# Patient Record
Sex: Male | Born: 1985 | Race: Black or African American | Hispanic: No | Marital: Single | Smoking: Current every day smoker
Health system: Southern US, Community
[De-identification: ages and names within clinical notes are randomized; demographics above are authoritative.]

---
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
## 2010-05-26 ENCOUNTER — Emergency Department (HOSPITAL_COMMUNITY): Admission: EM | Admit: 2010-05-26 | Discharge: 2010-05-26 | Payer: Self-pay | Admitting: Emergency Medicine

## 2011-01-15 ENCOUNTER — Emergency Department (HOSPITAL_COMMUNITY)
Admission: EM | Admit: 2011-01-15 | Discharge: 2011-01-15 | Disposition: A | Discharge status: Home / Self Care | Payer: Self-pay | Attending: Emergency Medicine | Admitting: Emergency Medicine

## 2011-01-15 ENCOUNTER — Emergency Department (HOSPITAL_COMMUNITY): Payer: Self-pay

## 2011-01-15 DIAGNOSIS — R51 Headache: Secondary | ICD-10-CM | POA: Insufficient documentation

## 2011-01-15 DIAGNOSIS — R0789 Other chest pain: Secondary | ICD-10-CM | POA: Insufficient documentation

## 2013-06-29 ENCOUNTER — Emergency Department (HOSPITAL_COMMUNITY): Payer: Self-pay

## 2013-06-29 ENCOUNTER — Encounter (HOSPITAL_COMMUNITY): Payer: Self-pay | Admitting: Emergency Medicine

## 2013-06-29 ENCOUNTER — Emergency Department (HOSPITAL_COMMUNITY)
Admission: EM | Admit: 2013-06-29 | Discharge: 2013-06-29 | Disposition: A | Discharge status: Home / Self Care | Payer: Self-pay | Attending: Emergency Medicine | Admitting: Emergency Medicine

## 2013-06-29 DIAGNOSIS — S93402A Sprain of unspecified ligament of left ankle, initial encounter: Secondary | ICD-10-CM

## 2013-06-29 DIAGNOSIS — X500XXA Overexertion from strenuous movement or load, initial encounter: Secondary | ICD-10-CM | POA: Insufficient documentation

## 2013-06-29 DIAGNOSIS — F17200 Nicotine dependence, unspecified, uncomplicated: Secondary | ICD-10-CM | POA: Insufficient documentation

## 2013-06-29 DIAGNOSIS — S93409A Sprain of unspecified ligament of unspecified ankle, initial encounter: Secondary | ICD-10-CM | POA: Insufficient documentation

## 2013-06-29 DIAGNOSIS — Y9389 Activity, other specified: Secondary | ICD-10-CM | POA: Insufficient documentation

## 2013-06-29 DIAGNOSIS — Y929 Unspecified place or not applicable: Secondary | ICD-10-CM | POA: Insufficient documentation

## 2013-06-29 DIAGNOSIS — Z880 Allergy status to penicillin: Secondary | ICD-10-CM | POA: Insufficient documentation

## 2013-06-29 NOTE — ED Provider Notes (Signed)
CSN: [id number]     Arrival date & time 06/29/13  1134 History  This chart was scribed for non-physician practitioner, Raymon Mutton, PA-C working with Raeford Razor, MD by Greggory Stallion, ED scribe. This patient was seen in room TR06C/TR06C and the patient's care was started at 3:34 PM.   Chief Complaint  Patient presents with  . Ankle Pain   The history is provided by the patient. No language interpreter was used.   HPI Comments: Cole Johnson is a 27 y.o. male who presents to the Emergency Department complaining of left ankle injury that occurred last night. He states he jumped off of his truck and landed on his ankle wrong. Pt has sudden onset throbbing left ankle pain with associated swelling. He has used ice with little relief. Denies numbness, tingling, loss of sensation. Denies previous injury to ankle.   History reviewed. No pertinent past medical history. History reviewed. No pertinent past surgical history. History reviewed. No pertinent family history. History  Substance Use Topics  . Smoking status: Current Every Day Smoker  . Smokeless tobacco: Not on file  . Alcohol Use: Yes    Review of Systems  Musculoskeletal: Positive for arthralgias and joint swelling.  Neurological: Negative for numbness.  All other systems reviewed and are negative.    Allergies  Penicillins  Home Medications  No current outpatient prescriptions on file.  BP 145/67  Pulse 102  Temp(Src) 98.1 F (36.7 C) (Oral)  Resp 18  Wt 183 lb 3 oz (83.093 kg)  SpO2 100%  Physical Exam  Nursing note and vitals reviewed. Constitutional: He is oriented to person, place, and time. He appears well-developed and well-nourished. No distress.  HENT:  Head: Normocephalic and atraumatic.  Eyes: EOM are normal.  Neck: Normal range of motion. Neck supple.  Cardiovascular: Normal rate, regular rhythm and normal heart sounds.   Pulmonary/Chest: Effort normal and breath sounds normal. No respiratory  distress. He has no wheezes. He has no rales.  Musculoskeletal: Normal range of motion. He exhibits tenderness.       Feet:  Negative swelling, erythema, inflammation, warmth, ecchymosis, deformities noted to the left ankle. Mild discomfort upon palpation to the medial aspect of the left ankle and dorsal aspect of the left foot. Full ROM to left ankle and digits of the left foot.   Neurological: He is alert and oriented to person, place, and time. He exhibits normal muscle tone. Coordination normal.  Strength 5+/5+ to bilateral lower extremities with resistance applied equal distribution. Strength intact to the digits of the left foot Sensation intact with differentiation to sharp and dull touch to lower extremities bilaterally.   Skin: Skin is warm and dry.  Psychiatric: He has a normal mood and affect. His behavior is normal.    ED Course  Procedures (including critical care time)  DIAGNOSTIC STUDIES: Oxygen Saturation is 100% on RA, normal by my interpretation.    COORDINATION OF CARE: 3:37 PM-Discussed treatment plan which includes ankle brace, crutches and ibuprofen with pt at bedside and pt agreed to plan. Advised pt to follow up with orthopedics.   Dg Ankle Complete Left  06/29/2013   CLINICAL DATA:  Ankle injury and pain.  EXAM: LEFT ANKLE COMPLETE - 3+ VIEW  COMPARISON:  None.  FINDINGS: There is no evidence of fracture, dislocation, or joint effusion. There is no evidence of arthropathy or other focal bone abnormality. Soft tissues are unremarkable.  IMPRESSION: Negative.   Electronically Signed   By: Myles Rosenthal  M.D.   On: 06/29/2013 13:28    Labs Review Labs Reviewed - No data to display Imaging Review Dg Ankle Complete Left  06/29/2013   CLINICAL DATA:  Ankle injury and pain.  EXAM: LEFT ANKLE COMPLETE - 3+ VIEW  COMPARISON:  None.  FINDINGS: There is no evidence of fracture, dislocation, or joint effusion. There is no evidence of arthropathy or other focal bone  abnormality. Soft tissues are unremarkable.  IMPRESSION: Negative.   Electronically Signed   By: Myles Rosenthal M.D.   On: 06/29/2013 13:28    EKG Interpretation   None       MDM   1. Ankle sprain, left, initial encounter    Filed Vitals:   06/29/13 1138 06/29/13 1614  BP: 145/67 125/82  Pulse: 102 72  Temp: 98.1 F (36.7 C) 97.6 F (36.4 C)  TempSrc: Oral Oral  Resp: 18 16  Weight: 183 lb 3 oz (83.093 kg)   SpO2: 100% 98%   I personally performed the services described in this documentation, which was scribed in my presence. The recorded information has been reviewed and is accurate.  Patient presenting to the ED with left ankle pain. Patient reported that the other day he was jumping down from his truck and landed on a rock which caused his left foot to shift in an inverted manner leading to pain. Patient reported that there is a constant throbbing sensation to the left ankle. Stated that he noticed some swelling to the area. Reported that he has not iced, elevated the ankle.  Alert and oriented. GCS 15. Heart rate and rhythm normal. Pulses palpable and strong, radial and DP 2+ bilaterally. Lungs clear to auscultation to upper and lower lobes bilaterally. Negative swelling, erythema, inflammation, warmth, ecchymosis, deformities noted to the left ankle. Discomfort upon palpation to the the medial aspect of the left ankle and dorsal aspect of the left foot. Full ROM to the left ankle with minimal discomfort noted. Most discomfort noted with eversion and inversion of the left foot. Full ROM to the digits of the left food. Strength intact to the foot and digits. Sensation intact.  Plain film of the left ankle negative findings.  Negative fracture or dislocations. Patient neurovascularly intact. Patient placed ASO ankle brace. Patient has crutches. Patient stable, afebrile. Discharged patient. Recommended Ibuprofen for pain control. Referred patient to orthopedics. Discussed rest, ice,  elevation. Discussed with patient to continue to monitor symptoms and if symptoms are to worsen or change to report back to the ED - strict return instructions given.  Patient agreed to plan of car, understood, all questions answered.   Raymon Mutton, PA-C 06/30/13 1224

## 2013-06-29 NOTE — ED Notes (Signed)
Pt states he twisted left foot last pm getting out of a truck. No deformity noted.

## 2013-06-29 NOTE — Progress Notes (Signed)
Orthopedic Tech Progress Note Patient Details:  Cole Johnson August 20, 1985 [id number]  Ortho Devices Type of Ortho Device: ASO Ortho Device/Splint Interventions: Application   Jennye Moccasin 06/29/2013, 4:10 PM

## 2013-06-29 NOTE — ED Notes (Signed)
Per pt sts jumped on left ankle last night and he thinks he twisted it. sts pain ans swelling.

## 2013-06-29 NOTE — ED Notes (Signed)
Ortho called for aso ankle.

## 2013-07-07 NOTE — ED Provider Notes (Signed)
Medical screening examination/treatment/procedure(s) were performed by non-physician practitioner and as supervising physician I was immediately available for consultation/collaboration.  EKG Interpretation   None        Raeford Razor, MD 07/07/13 1712

## 2014-02-22 ENCOUNTER — Encounter (HOSPITAL_COMMUNITY): Payer: Self-pay | Admitting: Emergency Medicine

## 2014-02-22 ENCOUNTER — Emergency Department (HOSPITAL_COMMUNITY)
Admission: EM | Admit: 2014-02-22 | Discharge: 2014-02-22 | Disposition: A | Discharge status: Home / Self Care | Payer: Self-pay | Attending: Emergency Medicine | Admitting: Emergency Medicine

## 2014-02-22 DIAGNOSIS — Z880 Allergy status to penicillin: Secondary | ICD-10-CM | POA: Insufficient documentation

## 2014-02-22 DIAGNOSIS — J029 Acute pharyngitis, unspecified: Secondary | ICD-10-CM | POA: Insufficient documentation

## 2014-02-22 DIAGNOSIS — F17200 Nicotine dependence, unspecified, uncomplicated: Secondary | ICD-10-CM | POA: Insufficient documentation

## 2014-02-22 LAB — RAPID STREP SCREEN (MED CTR MEBANE ONLY): STREPTOCOCCUS, GROUP A SCREEN (DIRECT): NEGATIVE

## 2014-02-22 MED ORDER — HYDROCODONE-ACETAMINOPHEN 7.5-325 MG/15ML PO SOLN: 15.0000 mL | ORAL | Status: DC | PRN

## 2014-02-22 MED ORDER — CLINDAMYCIN HCL 150 MG PO CAPS: 300.0000 mg | ORAL_CAPSULE | Freq: Three times a day (TID) | ORAL | Status: DC

## 2014-02-22 NOTE — ED Notes (Signed)
Pt reports 1 week of sore throat

## 2014-02-22 NOTE — ED Provider Notes (Signed)
CSN: [id number]     Arrival date & time 02/22/14  1016 History  This chart was scribed for non-physician practitioner Sharilyn SitesLisa Megumi Treaster, PA-C working with [name], MD by Leone PayorSonum Patel, ED Scribe. This patient was seen in room TR07C/TR07C and the patient's care was started at 11:15 AM.    Chief Complaint  Patient presents with  . Sore Throat    The history is provided by the patient. No language interpreter was used.   HPI Comments: Cole KindDavid A Johnson is a 28 y.o. male who presents to the Emergency Department complaining of 1 week  of gradual onset, gradually worsening, constant sore throat. He describes the pain as sharp and states it is worse with swallowing. He denies sick contacts with similar symptoms. He denies fever, cough, difficulty swallowing. He denies history of recurrent strep throat.  No nausea, vomiting, diarrhea.  No intervention tried PTA.  History reviewed. No pertinent past medical history. History reviewed. No pertinent past surgical history. History reviewed. No pertinent family history. History  Substance Use Topics  . Smoking status: Current Every Day Smoker -- 0.30 packs/day    Types: Cigarettes  . Smokeless tobacco: Not on file  . Alcohol Use: Yes     Comment: about 20 oz of beer per day and .5 oz of liquor    Review of Systems  HENT: Positive for sore throat. Negative for trouble swallowing.   All other systems reviewed and are negative.     Allergies  Penicillins  Home Medications   Prior to Admission medications   Medication Sig Start Date End Date Taking? Authorizing Provider  benzocaine-menthol (SORE THROAT LOZENGES) 6-10 MG lozenge Take 1 lozenge by mouth as needed for sore throat.   Yes Historical Provider, MD   BP 139/88  Pulse 83  Temp(Src) 98.7 F (37.1 C) (Oral)  Resp 17  Ht 6\' 3"  (1.905 m)  Wt 187 lb (84.823 kg)  BMI 23.37 kg/m2  SpO2 99%  Physical Exam  Nursing note and vitals reviewed. Constitutional: He is oriented to person,  place, and time. He appears well-developed and well-nourished. No distress.  HENT:  Head: Normocephalic and atraumatic.  Mouth/Throat: Uvula is midline and mucous membranes are normal. No oral lesions. No trismus in the jaw. Posterior oropharyngeal erythema present. No oropharyngeal exudate, posterior oropharyngeal edema or tonsillar abscesses.  Tonsils 2+ bilaterally, small exudate present on right tonsil; uvula midline without peritonsillar abscess; handling secretions appropriately; no difficulty swallowing or speaking  Eyes: Conjunctivae and EOM are normal. Pupils are equal, round, and reactive to light.  Neck: Normal range of motion. Neck supple.  Cardiovascular: Normal rate, regular rhythm and normal heart sounds.   Pulmonary/Chest: Effort normal and breath sounds normal. No respiratory distress. He has no wheezes.  Musculoskeletal: Normal range of motion.  Lymphadenopathy:    He has no cervical adenopathy.  Neurological: He is alert and oriented to person, place, and time.  Skin: Skin is warm and dry. He is not diaphoretic.  Psychiatric: He has a normal mood and affect.    ED Course  Procedures (including critical care time)  DIAGNOSTIC STUDIES: Oxygen Saturation is 99% on RA, normal by my interpretation.    COORDINATION OF CARE: 11:19 AM Discussed treatment plan with pt at bedside and pt agreed to plan.   Labs Review Labs Reviewed  RAPID STREP SCREEN  CULTURE, GROUP A STREP    Imaging Review No results found.   EKG Interpretation None      MDM  Final diagnoses:  Sore throat   Rapid strep negative, culture pending.  Patient afebrile and non-toxic appearing without airway compromise.  Given current sx and presence of exudate on right tonsil will initiate tx for strep with clindamycin given his penicillin allergy.  Hycet given for pain.  FU with wellness clinic if problems occur.  Discussed plan with patient, he/she acknowledged understanding and agreed with plan of  care.  Return precautions given for new or worsening symptoms.  I personally performed the services described in this documentation, which was scribed in my presence. The recorded information has been reviewed and is accurate.  Garlon Hatchet, PA-C 02/22/14 1139

## 2014-02-22 NOTE — Discharge Instructions (Signed)
Take the prescribed medication as directed. Follow-up with [facility] if problems occur. Return to the ED for new or worsening symptoms.

## 2014-02-23 NOTE — ED Provider Notes (Signed)
Medical screening examination/treatment/procedure(s) were performed by non-physician practitioner and as supervising physician I was immediately available for consultation/collaboration.  Audree CamelScott T Sharvil Hoey, MD 02/23/14 [phone]

## 2014-02-24 LAB — CULTURE, GROUP A STREP

## 2014-04-08 ENCOUNTER — Encounter (HOSPITAL_COMMUNITY): Payer: Self-pay | Admitting: Emergency Medicine

## 2014-04-08 ENCOUNTER — Emergency Department (HOSPITAL_COMMUNITY)
Admission: EM | Admit: 2014-04-08 | Discharge: 2014-04-09 | Disposition: A | Discharge status: Home / Self Care | Payer: Self-pay | Attending: Emergency Medicine | Admitting: Emergency Medicine

## 2014-04-08 DIAGNOSIS — R109 Unspecified abdominal pain: Secondary | ICD-10-CM | POA: Insufficient documentation

## 2014-04-08 DIAGNOSIS — F17200 Nicotine dependence, unspecified, uncomplicated: Secondary | ICD-10-CM | POA: Insufficient documentation

## 2014-04-08 DIAGNOSIS — Z880 Allergy status to penicillin: Secondary | ICD-10-CM | POA: Insufficient documentation

## 2014-04-08 DIAGNOSIS — R112 Nausea with vomiting, unspecified: Secondary | ICD-10-CM | POA: Insufficient documentation

## 2014-04-08 DIAGNOSIS — R197 Diarrhea, unspecified: Secondary | ICD-10-CM | POA: Insufficient documentation

## 2014-04-08 LAB — COMPREHENSIVE METABOLIC PANEL
ALBUMIN: 4.3 g/dL (ref 3.5–5.2)
ALT: 29 U/L (ref 0–53)
AST: 46 U/L — ABNORMAL HIGH (ref 0–37)
Alkaline Phosphatase: 126 U/L — ABNORMAL HIGH (ref 39–117)
Anion gap: 14 (ref 5–15)
BUN: 6 mg/dL (ref 6–23)
CO2: 25 mEq/L (ref 19–32)
Calcium: 10.2 mg/dL (ref 8.4–10.5)
Chloride: 99 mEq/L (ref 96–112)
Creatinine, Ser: 0.75 mg/dL (ref 0.50–1.35)
GFR calc non Af Amer: >90 mL/min (ref >90)
Glucose, Bld: 99 mg/dL (ref 70–99)
Potassium: 4 mEq/L (ref 3.7–5.3)
Sodium: 138 mEq/L (ref 137–147)
Total Bilirubin: 0.4 mg/dL (ref 0.3–1.2)
Total Protein: 9.9 g/dL — ABNORMAL HIGH (ref 6.0–8.3)

## 2014-04-08 LAB — CBC WITH DIFFERENTIAL/PLATELET
BASOS ABS: 0 10*3/uL (ref 0.0–0.1)
Basophils Relative: 0 % (ref 0–1)
EOS ABS: 0.1 10*3/uL (ref 0.0–0.7)
EOS PCT: 1 % (ref 0–5)
HEMATOCRIT: 42.3 % (ref 39.0–52.0)
HEMOGLOBIN: 15.5 g/dL (ref 13.0–17.0)
LYMPHS ABS: 1.9 10*3/uL (ref 0.7–4.0)
LYMPHS PCT: 23 % (ref 12–46)
MCH: 28.2 pg (ref 26.0–34.0)
MCHC: 36.6 g/dL — AB (ref 30.0–36.0)
MCV: 76.9 fL — ABNORMAL LOW (ref 78.0–100.0)
MONOS PCT: 10 % (ref 3–12)
Monocytes Absolute: 0.8 10*3/uL (ref 0.1–1.0)
NEUTROS PCT: 67 % (ref 43–77)
Neutro Abs: 5.7 10*3/uL (ref 1.7–7.7)
PLATELETS: 326 10*3/uL (ref 150–400)
RBC: 5.5 MIL/uL (ref 4.22–5.81)
RDW: 14 % (ref 11.5–15.5)
WBC: 8.5 10*3/uL (ref 4.0–10.5)

## 2014-04-08 LAB — LIPASE, BLOOD: Lipase: 23 U/L (ref 11–59)

## 2014-04-08 MED ORDER — SODIUM CHLORIDE 0.9 % IV SOLN
1000.0000 mL | Freq: Once | INTRAVENOUS | Status: AC
  Administered 2014-04-08: 1000 mL via INTRAVENOUS

## 2014-04-08 MED ORDER — SODIUM CHLORIDE 0.9 % IV SOLN
1000.0000 mL | INTRAVENOUS | Status: DC
  Administered 2014-04-08: 1000 mL via INTRAVENOUS

## 2014-04-08 MED ORDER — ONDANSETRON HCL 4 MG/2ML IJ SOLN
4.0000 mg | Freq: Once | INTRAMUSCULAR | Status: AC
  Administered 2014-04-08: 4 mg via INTRAVENOUS
  Filled 2014-04-08: qty 2

## 2014-04-08 MED ORDER — HYDROMORPHONE HCL 1 MG/ML IJ SOLN
1.0000 mg | Freq: Once | INTRAMUSCULAR | Status: AC
  Administered 2014-04-08: 1 mg via INTRAVENOUS
  Filled 2014-04-08: qty 1

## 2014-04-08 MED ORDER — IOHEXOL 300 MG/ML  SOLN
25.0000 mL | Freq: Once | INTRAMUSCULAR | Status: AC | PRN
  Administered 2014-04-08: 25 mL via ORAL

## 2014-04-08 NOTE — ED Notes (Signed)
Pt c/o abdominal pain all day, emesis x 1, no diarrhea. Last BM today and normal

## 2014-04-08 NOTE — ED Notes (Signed)
No changes, alert, NAD, calm, interactive, drinking CT contrast

## 2014-04-09 ENCOUNTER — Emergency Department (HOSPITAL_COMMUNITY): Payer: Self-pay

## 2014-04-09 MED ORDER — ONDANSETRON 8 MG PO TBDP: 8.0000 mg | ORAL_TABLET | Freq: Three times a day (TID) | ORAL | Status: DC | PRN

## 2014-04-09 MED ORDER — IOHEXOL 300 MG/ML  SOLN
100.0000 mL | Freq: Once | INTRAMUSCULAR | Status: AC | PRN
  Administered 2014-04-09: 100 mL via INTRAVENOUS

## 2014-04-09 MED ORDER — NAPROXEN 500 MG PO TABS: 500.0000 mg | ORAL_TABLET | Freq: Two times a day (BID) | ORAL | Status: DC

## 2014-04-09 NOTE — ED Notes (Signed)
EDP at [facility], results and plan for d/c discussed.  UA cancelled.

## 2014-04-09 NOTE — ED Notes (Addendum)
Family at [facility]. Pt standing at [facility] using urinal, attempting urine sample. No changes, "feels better", alert, NAD, calm, interactive. VSS.

## 2014-04-09 NOTE — ED Notes (Signed)
Alert, NAD, calm, interactive. Denies nausea. Pain decreased. Pending results.

## 2014-04-09 NOTE — Discharge Instructions (Signed)

## 2014-04-09 NOTE — ED Provider Notes (Signed)
CSN: [id number]     Arrival date & time 04/08/14  2118 History   First MD Initiated Contact with Patient 04/08/14 2301     Chief Complaint  Patient presents with  . Abdominal Pain      HPI Patient presents to the emergency department complaining of abdominal pain for most of the day.  He reports nausea and vomiting.  Denies hematemesis.  Does report some diarrhea.  Denies fevers or chills.  States his pain is intermittent and crampy and now is more constant located on the right side of his abdomen.  Nothing worsens or improves his pain.  His pain is now constant.  His pain is moderate in severity   History reviewed. No pertinent past medical history. History reviewed. No pertinent past surgical history. History reviewed. No pertinent family history. History  Substance Use Topics  . Smoking status: Current Every Day Smoker -- 0.30 packs/day    Types: Cigarettes  . Smokeless tobacco: Never Used  . Alcohol Use: Yes     Comment: about 20 oz of beer per day and .5 oz of liquor    Review of Systems  All other systems reviewed and are negative.     Allergies  Penicillins  Home Medications   Prior to Admission medications   Medication Sig Start Date End Date Taking? Authorizing Provider  naproxen (NAPROSYN) 500 MG tablet Take 1 tablet (500 mg total) by mouth 2 (two) times daily. 04/09/14   Munachimso Palin M Itzamara Casas, MD  ondansetron (ZOFRAN ODT) 8 MG disintegrating tablet Take 1 tablet (8 mg total) by mouth every 8 (eight) hours as needed for nausea or vomiting. 04/09/14   Chyla Schlender M Latanya Hemmer, MD   BP 170/86  Pulse 60  Temp(Src) 98.3 F (36.8 C)  Resp 19  SpO2 97% Physical Exam  Nursing note and vitals reviewed. Constitutional: He is oriented to person, place, and time. He appears well-developed and well-nourished.  HENT:  Head: Normocephalic and atraumatic.  Eyes: EOM are normal.  Neck: Normal range of motion.  Cardiovascular: Normal rate, regular rhythm, normal heart sounds and intact  distal pulses.   Pulmonary/Chest: Effort normal and breath sounds normal. No respiratory distress.  Abdominal: Soft. He exhibits no distension.  Mild right-sided abdominal tenderness without rebound  Musculoskeletal: Normal range of motion.  Neurological: He is alert and oriented to person, place, and time.  Skin: Skin is warm and dry.  Psychiatric: He has a normal mood and affect. Judgment normal.    ED Course  Procedures (including critical care time) Labs Review Labs Reviewed  CBC WITH DIFFERENTIAL - Abnormal; Notable for the following:    MCV 76.9 (*)    MCHC 36.6 (*)    All other components within normal limits  COMPREHENSIVE METABOLIC PANEL - Abnormal; Notable for the following:    Total Protein 9.9 (*)    AST 46 (*)    Alkaline Phosphatase 126 (*)    All other components within normal limits  LIPASE, BLOOD  URINALYSIS, ROUTINE W REFLEX MICROSCOPIC    Imaging Review Ct Abdomen Pelvis W Contrast  04/09/2014   CLINICAL DATA:  Abdominal pain, vomiting  EXAM: CT ABDOMEN AND PELVIS WITH CONTRAST  TECHNIQUE: Multidetector CT imaging of the abdomen and pelvis was performed using the standard protocol following bolus [phone] ear.  Hepatobiliary: Liver is notable for a very mildly  nodular hepatic contour (series 2/image 13), equivocal. No focal hepatic lesion is seen.  Gallbladder is unremarkable. No intrahepatic or extrahepatic ductal dilatation.  Spleen: Within normal limits.  Pancreas: Within normal limits.  Stomach/Bowel: Stomach is unremarkable.  No evidence of bowel obstruction.  Normal appendix.  Adrenals/urinary tract: Adrenal glands are unremarkable.  Kidneys within normal limits.  No hydronephrosis.  Bladder is within normal limits.  Vascular/Lymphatic: No evidence of abdominal aortic aneurysm.  Small ileocolic/right lower quadrant nodes  measuring up to 6 mm short axis, likely reactive. Small left inguinal nodes measuring up to 12 mm short axis, likely reactive.  Reproductive: Prostate is unremarkable.  Musculoskeletal: Visualized osseous structures are within normal limits.  Other: No abdominopelvic ascites.  IMPRESSION: No evidence of bowel obstruction.  Normal appendix.  No CT findings to account for the patient's abdominal pain.   Electronically Signed   By: Charline Bills M.D.   On: 04/09/2014 00:22  I personally reviewed the imaging tests through PACS system I reviewed available ER/hospitalization records through the EMR    EKG Interpretation None      MDM   Final diagnoses:  Abdominal pain, unspecified abdominal location    Patient presents with nonspecific abdominal pain.  CT scan labs without significant abnormality.  Discharge home in good condition.  Recommended followup with her primary care physician.  He understands to return to the ER for new or worsening symptoms    Lyanne Co, MD 04/09/14 [phone]

## 2014-06-19 ENCOUNTER — Emergency Department (HOSPITAL_COMMUNITY)
Admission: EM | Admit: 2014-06-19 | Discharge: 2014-06-19 | Disposition: A | Discharge status: Home / Self Care | Payer: Self-pay | Attending: Emergency Medicine | Admitting: Emergency Medicine

## 2014-06-19 ENCOUNTER — Encounter (HOSPITAL_COMMUNITY): Payer: Self-pay | Admitting: Emergency Medicine

## 2014-06-19 DIAGNOSIS — S199XXA Unspecified injury of neck, initial encounter: Secondary | ICD-10-CM | POA: Insufficient documentation

## 2014-06-19 DIAGNOSIS — M542 Cervicalgia: Secondary | ICD-10-CM

## 2014-06-19 DIAGNOSIS — Y998 Other external cause status: Secondary | ICD-10-CM | POA: Insufficient documentation

## 2014-06-19 DIAGNOSIS — Y9389 Activity, other specified: Secondary | ICD-10-CM | POA: Insufficient documentation

## 2014-06-19 DIAGNOSIS — M62838 Other muscle spasm: Secondary | ICD-10-CM | POA: Insufficient documentation

## 2014-06-19 DIAGNOSIS — Z880 Allergy status to penicillin: Secondary | ICD-10-CM | POA: Insufficient documentation

## 2014-06-19 DIAGNOSIS — Z791 Long term (current) use of non-steroidal anti-inflammatories (NSAID): Secondary | ICD-10-CM | POA: Insufficient documentation

## 2014-06-19 DIAGNOSIS — Z720 Tobacco use: Secondary | ICD-10-CM | POA: Insufficient documentation

## 2014-06-19 DIAGNOSIS — Y92410 Unspecified street and highway as the place of occurrence of the external cause: Secondary | ICD-10-CM | POA: Insufficient documentation

## 2014-06-19 MED ORDER — NAPROXEN 500 MG PO TABS: 500.0000 mg | ORAL_TABLET | Freq: Two times a day (BID) | ORAL | Status: DC | PRN

## 2014-06-19 MED ORDER — HYDROCODONE-ACETAMINOPHEN 5-325 MG PO TABS: 1.0000 | ORAL_TABLET | Freq: Four times a day (QID) | ORAL | Status: DC | PRN

## 2014-06-19 MED ORDER — CYCLOBENZAPRINE HCL 10 MG PO TABS: 10.0000 mg | ORAL_TABLET | Freq: Three times a day (TID) | ORAL | Status: DC | PRN
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
## 2017-01-06 ENCOUNTER — Emergency Department (HOSPITAL_COMMUNITY)
Admission: EM | Admit: 2017-01-06 | Discharge: 2017-01-06 | Disposition: A | Discharge status: Home / Self Care | Payer: Self-pay | Attending: Emergency Medicine | Admitting: Emergency Medicine

## 2017-01-06 ENCOUNTER — Encounter (HOSPITAL_COMMUNITY): Payer: Self-pay | Admitting: *Deleted

## 2017-01-06 DIAGNOSIS — R112 Nausea with vomiting, unspecified: Secondary | ICD-10-CM | POA: Insufficient documentation

## 2017-01-06 DIAGNOSIS — F17210 Nicotine dependence, cigarettes, uncomplicated: Secondary | ICD-10-CM | POA: Insufficient documentation

## 2017-01-06 DIAGNOSIS — Z79899 Other long term (current) drug therapy: Secondary | ICD-10-CM | POA: Insufficient documentation

## 2017-01-06 DIAGNOSIS — R109 Unspecified abdominal pain: Secondary | ICD-10-CM | POA: Insufficient documentation

## 2017-01-06 DIAGNOSIS — R197 Diarrhea, unspecified: Secondary | ICD-10-CM | POA: Insufficient documentation

## 2017-01-06 LAB — URINALYSIS, ROUTINE W REFLEX MICROSCOPIC
BACTERIA UA: NONE SEEN
BILIRUBIN URINE: NEGATIVE
Glucose, UA: NEGATIVE mg/dL
KETONES UR: 80 mg/dL — AB
LEUKOCYTES UA: NEGATIVE
NITRITE: NEGATIVE
PROTEIN: NEGATIVE mg/dL
Specific Gravity, Urine: 1.024 (ref 1.005–1.030)
pH: 5 (ref 5.0–8.0)

## 2017-01-06 LAB — COMPREHENSIVE METABOLIC PANEL
ALBUMIN: 5.1 g/dL — AB (ref 3.5–5.0)
ALK PHOS: 96 U/L (ref 38–126)
ALT: 50 U/L (ref 17–63)
ANION GAP: 12 (ref 5–15)
AST: 60 U/L — ABNORMAL HIGH (ref 15–41)
BUN: 11 mg/dL (ref 6–20)
CALCIUM: 10.3 mg/dL (ref 8.9–10.3)
CHLORIDE: 99 mmol/L — AB (ref 101–111)
CO2: 25 mmol/L (ref 22–32)
Creatinine, Ser: 0.75 mg/dL (ref 0.61–1.24)
GFR calc non Af Amer: >60 mL/min (ref >60)
GLUCOSE: 85 mg/dL (ref 65–99)
Potassium: 3.7 mmol/L (ref 3.5–5.1)
SODIUM: 136 mmol/L (ref 135–145)
Total Bilirubin: 0.9 mg/dL (ref 0.3–1.2)
Total Protein: 8.9 g/dL — ABNORMAL HIGH (ref 6.5–8.1)

## 2017-01-06 LAB — CBC WITH DIFFERENTIAL/PLATELET
BASOS PCT: 0 %
Basophils Absolute: 0 10*3/uL (ref 0.0–0.1)
EOS ABS: 0.3 10*3/uL (ref 0.0–0.7)
EOS PCT: 4 %
HCT: 43.7 % (ref 39.0–52.0)
HEMOGLOBIN: 16.1 g/dL (ref 13.0–17.0)
Lymphocytes Relative: 31 %
Lymphs Abs: 2.4 10*3/uL (ref 0.7–4.0)
MCH: 28.8 pg (ref 26.0–34.0)
MCHC: 36.8 g/dL — AB (ref 30.0–36.0)
MCV: 78 fL (ref 78.0–100.0)
Monocytes Absolute: 0.6 10*3/uL (ref 0.1–1.0)
Monocytes Relative: 8 %
NEUTROS PCT: 58 %
Neutro Abs: 4.5 10*3/uL (ref 1.7–7.7)
PLATELETS: 253 10*3/uL (ref 150–400)
RBC: 5.6 MIL/uL (ref 4.22–5.81)
RDW: 14.2 % (ref 11.5–15.5)
WBC: 7.8 10*3/uL (ref 4.0–10.5)

## 2017-01-06 MED ORDER — ONDANSETRON 4 MG PO TBDP: 4.0000 mg | ORAL_TABLET | Freq: Three times a day (TID) | ORAL | 0 refills | Status: DC | PRN

## 2017-01-06 MED ORDER — ONDANSETRON HCL 4 MG/2ML IJ SOLN
4.0000 mg | Freq: Once | INTRAMUSCULAR | Status: AC | PRN
  Administered 2017-01-06: 4 mg via INTRAVENOUS
  Filled 2017-01-06: qty 2

## 2017-01-06 MED ORDER — DICYCLOMINE HCL 20 MG PO TABS: 20.0000 mg | ORAL_TABLET | Freq: Two times a day (BID) | ORAL | 0 refills | Status: AC | PRN

## 2017-01-06 MED ORDER — DICYCLOMINE HCL 10 MG PO CAPS
10.0000 mg | ORAL_CAPSULE | Freq: Once | ORAL | Status: AC
  Administered 2017-01-06: 10 mg via ORAL
  Filled 2017-01-06: qty 1

## 2017-01-06 MED ORDER — SODIUM CHLORIDE 0.9 % IV BOLUS (SEPSIS)
1000.0000 mL | Freq: Once | INTRAVENOUS | Status: AC
  Administered 2017-01-06: 1000 mL via INTRAVENOUS

## 2017-01-06 NOTE — Discharge Instructions (Signed)
Follow up with your primary care doctor to discuss your hospital visit. Continue to hydrate orally with small sips of fluids throughout the day. Use Zofran as directed for nausea & vomiting. Bentyl as needed for stomach cramping.  The 'BRAT' diet is suggested, then progress to diet as tolerated as symptoms abate.  Bananas.  Rice.  Applesauce.  Toast (and other simple starches such as crackers, potatoes, noodles).   SEEK IMMEDIATE MEDICAL ATTENTION IF: You begin having localized abdominal pain that does not go away or becomes severe A temperature above 101 develops Repeated vomiting occurs (multiple uncontrollable episodes) or you are unable to keep fluids down Blood is being passed in stools or vomit (bright red or black tarry stools).  New or worsening symptoms develop, any additional concerns.   Please have a low threshold for returning to the ER if your symptoms get worse or are not improving in the next two days.

## 2017-01-06 NOTE — ED Triage Notes (Signed)
Patient is alert and oriented x4.  He is complaining of nausea, vomiting and diarrhea that started last Friday.  Currently patient started that he is have left side flank pain 8 of 10.

## 2017-01-06 NOTE — ED Provider Notes (Signed)
[facility]Y DEPT Provider Note   CSN: [id number] Arrival date & time: 01/06/17  1305  By signing my name below, I, Cole Johnson, attest that this documentation has been prepared under the direction and in the presence of [facility] Center[facility]ime Ward, PA-C. Electronically Signed: Linna Darnerussell Johnson, Scribe. 01/06/2017. 3:19 PM.  History   Chief Complaint Chief Complaint  Patient presents with  . Emesis   The history is provided by the patient. No language interpreter was used.    HPI Comments: Cole KindDavid A Johnson is a 31 y.o. male who presents to the Emergency Department complaining of persistent nausea, vomiting, and diarrhea beginning two days ago. He reports some associated left flank pain. He states his symptoms presented shortly after returning from [place] to [place] where he ate a lot of seafood that he typically does not eat. Patient states he has been unable to tolerate most PO intake. He most recently vomited shortly prior to arrival today and has had some diarrhea in the ED. Patient states his emesis has looked like stomach contents. Patient states that one of his stools appeared darker than usual this morning but notes that he had taken Pepto Bismol shortly beforehand. No blood in the stool. He reports that since this time his stools have appeared normal. He denies hematemesis, fevers, chills, or any other associated symptoms. No camping. No sick contacts.   History reviewed. No pertinent past medical history.  There are no active problems to display for this patient.   History reviewed. No pertinent surgical history.     Home Medications    Prior to Admission medications   Medication Sig Start Date End Date Taking? Authorizing Provider  cyclobenzaprine (FLEXERIL) 10 MG tablet Take 1 tablet (10 mg total) by mouth 3 (three) times daily as needed for muscle spasms. 06/19/14   Street, [place], PA-C  dicyclomine (BENTYL) 20 MG tablet Take 1 tablet (20 mg total) by mouth 2 (two) times daily  as needed for spasms. 01/06/17   Ward, Chase PicketJaime Pilcher, PA-C  HYDROcodone-acetaminophen (NORCO) 5-325 MG per tablet Take 1-2 tablets by mouth every 6 (six) hours as needed for severe pain. 06/19/14   Street, [place], PA-C  naproxen (NAPROSYN) 500 MG tablet Take 1 tablet (500 mg total) by mouth 2 (two) times daily. Patient not taking: Reported on 06/19/2014 04/09/14   Azalia Bilisampos, Kevin, MD  naproxen (NAPROSYN) 500 MG tablet Take 1 tablet (500 mg total) by mouth 2 (two) times daily as needed for mild pain, moderate pain or headache (TAKE WITH MEALS.). 06/19/14   Street, Mercedes, PA-C  ondansetron (ZOFRAN ODT) 4 MG disintegrating tablet Take 1 tablet (4 mg total) by mouth every 8 (eight) hours as needed for nausea or vomiting. 01/06/17   Ward, Chase PicketJaime Pilcher, PA-C    Family History History reviewed. No pertinent family history.  Social History Social History  Substance Use Topics  . Smoking status: Current Every Day Smoker    Packs/day: 0.30    Types: Cigarettes  . Smokeless tobacco: Never Used  . Alcohol use Yes     Comment: about 20 oz of beer per day and .5 oz of liquor     Allergies   Penicillins   Review of Systems Review of Systems  Constitutional: Negative for chills and fever.  Gastrointestinal: Positive for diarrhea, nausea and vomiting. Negative for blood in stool.  Genitourinary: Positive for flank pain (L).  All other systems reviewed and are negative.  Physical Exam Updated Vital Signs BP (!) 144/90   Pulse  75   Temp 98.3 F (36.8 C) (Oral)   Resp 16   Ht 6\' 3"  (1.905 m)   Wt 92.1 kg (203 lb 1.6 oz)   SpO2 99%   BMI 25.39 kg/m   Physical Exam  Constitutional: He is oriented to person, place, and time. He appears well-developed and well-nourished. No distress.  HENT:  Head: Normocephalic and atraumatic.  Cardiovascular: Normal rate, regular rhythm and normal heart sounds.   No murmur heard. Pulmonary/Chest: Effort normal and breath sounds normal. No respiratory  distress. He has no wheezes. He has no rales.  Abdominal: Soft. Bowel sounds are normal. He exhibits no distension.  Diffuse generalized abdominal pain. No CVA tenderness. Mild left flank tenderness with no overlying skin changes.  Neurological: He is alert and oriented to person, place, and time.  Skin: Skin is warm and dry.  Nursing note and vitals reviewed.  ED Treatments / Results  Labs (all labs ordered are listed, but only abnormal results are displayed) Labs Reviewed  CBC WITH DIFFERENTIAL/PLATELET - Abnormal; Notable for the following:       Result Value   MCHC 36.8 (*)    All other components within normal limits  COMPREHENSIVE METABOLIC PANEL - Abnormal; Notable for the following:    Chloride 99 (*)    Total Protein 8.9 (*)    Albumin 5.1 (*)    AST 60 (*)    All other components within normal limits  URINALYSIS, ROUTINE W REFLEX MICROSCOPIC - Abnormal; Notable for the following:    Hgb urine dipstick MODERATE (*)    Ketones, ur 80 (*)    Squamous Epithelial / LPF 0-5 (*)    All other components within normal limits    EKG  EKG Interpretation None       Radiology No results found.  Procedures Procedures (including critical care time)  DIAGNOSTIC STUDIES: Oxygen Saturation is 98% on RA, normal by my interpretation.    COORDINATION OF CARE: 3:17 PM Discussed treatment plan with pt at bedside and pt agreed to plan.  Medications Ordered in ED Medications  sodium chloride 0.9 % bolus 1,000 mL (0 mLs Intravenous Stopped 01/06/17 1654)  ondansetron (ZOFRAN) injection 4 mg (4 mg Intravenous Given 01/06/17 1636)  dicyclomine (BENTYL) capsule 10 mg (10 mg Oral Given 01/06/17 1809)     Initial Impression / Assessment and Plan / ED Course  I have reviewed the triage vital signs and the nursing notes.  Pertinent labs & imaging results that were available during my care of the patient were reviewed by me and considered in my medical decision making (see chart for  details).    Cole Johnson is a 31 y.o. male who presents to ED for generalized abdominal pain, nausea, vomiting x 2 days. Also has some left flank pain. On exam, patient is afebrile, non-toxic appearing with a non-surgical abdominal exam. Minimal flank tenderness and no CVA tenderness. IV fluids given. Labs obtained and reassuring. On repeat examination, patient is now tolerating PO with no episodes of emesis. He has continued to have loose stools. He feels much better after 1L IV hydration. Repeat abdominal exam with no peritoneal signs however still has some mild left flank pain. UA resulted showing no signs of infection, but does have moderate amount of hgb. Shared decision making with myself, patient and mother in regards to obtaining CT of the abdomen for possibility of kidney stone. Patient states that he does not believe that he needs CT at this point.  He is feeling much better and would like to go home. Will treat with symptomatic home care, however I spoke with patient and mother at length about reasons to return to ER and discussed that he may very well need to return and obtain imaging. Patient and mother appear reliable to return if worrisome symptoms develop or pain is not improving. Rx for zofran and bentyl given. Patient understands diagnosis and plan of care as dictated above. All questions answered.   Final Clinical Impressions(s) / ED Diagnoses   Final diagnoses:  Nausea vomiting and diarrhea    New Prescriptions Discharge Medication List as of 01/06/2017  6:36 PM    START taking these medications   Details  dicyclomine (BENTYL) 20 MG tablet Take 1 tablet (20 mg total) by mouth 2 (two) times daily as needed for spasms., Starting Wed 01/06/2017, Print       I personally performed the services described in this documentation, which was scribed in my presence. The recorded information has been reviewed and is accurate.    Ward, Chase Picket, PA-C 01/06/17 Marcelyn Bruins, MD 01/07/17 [phone]

## 2018-02-21 ENCOUNTER — Encounter (HOSPITAL_COMMUNITY): Payer: Self-pay | Admitting: Emergency Medicine

## 2018-02-21 ENCOUNTER — Other Ambulatory Visit: Payer: Self-pay

## 2018-02-21 ENCOUNTER — Emergency Department (HOSPITAL_COMMUNITY): Payer: Self-pay

## 2018-02-21 ENCOUNTER — Emergency Department (HOSPITAL_COMMUNITY)
Admission: EM | Admit: 2018-02-21 | Discharge: 2018-02-21 | Disposition: A | Discharge status: Home / Self Care | Payer: Self-pay | Attending: Emergency Medicine | Admitting: Emergency Medicine

## 2018-02-21 DIAGNOSIS — F17210 Nicotine dependence, cigarettes, uncomplicated: Secondary | ICD-10-CM | POA: Insufficient documentation

## 2018-02-21 DIAGNOSIS — W230XXA Caught, crushed, jammed, or pinched between moving objects, initial encounter: Secondary | ICD-10-CM | POA: Insufficient documentation

## 2018-02-21 DIAGNOSIS — Y939 Activity, unspecified: Secondary | ICD-10-CM | POA: Insufficient documentation

## 2018-02-21 DIAGNOSIS — Y999 Unspecified external cause status: Secondary | ICD-10-CM | POA: Insufficient documentation

## 2018-02-21 DIAGNOSIS — S6992XA Unspecified injury of left wrist, hand and finger(s), initial encounter: Secondary | ICD-10-CM | POA: Insufficient documentation

## 2018-02-21 DIAGNOSIS — Y92810 Car as the place of occurrence of the external cause: Secondary | ICD-10-CM | POA: Insufficient documentation

## 2018-02-21 NOTE — Discharge Instructions (Signed)
Please follow-up with your primary care provider regarding your visit today.  If you not a primary care provider please see their attached resources to establish one. Please return to the emergency department for any new or worsening symptoms. You may use the splint provided to help protect your finger, it is important to remove the splint periodically and move your finger to avoid it becoming stiff. You may use over-the-counter anti-inflammatory medication such as ibuprofen or Tylenol as directed on packaging for pain. Please see the attached instructions for rest, ice, compression, elevation. Please continue to place clean dressings and antibiotic ointment on your finer wound and change them daily.  If your wounds began draining pus, have increased pain or redness or there is streaking or swelling around your wounds please return to the emergency department as soon as possible.  SEEK MEDICAL CARE IF: Your pain is not controlled with medicine. Your bruising or swelling gets worse. Your cast or splint is damaged. Your finger is numb or blue. Your finger feels colder than normal.  RESOURCE GUIDE  Chronic Pain Problems: Contact [facility]  [phone] Patients need to be referred by their primary care doctor.  Insufficient Money for Medicine: Contact United Way:  call "211" or [facility] [phone].  No Primary Care Doctor: Call [facility] Connect  [phone] - can help you locate a primary care doctor that  accepts your insurance, provides certain services, etc. Physician Referral Service- [phone]  Agencies that provide inexpensive medical care: [facility]  [phone] [facility]  [phone] [facility]  [phone] [facility]  [phone] Planned Parenthood  [phone] [facility]  [phone]  Medicaid-accepting [facility] Providers: [facility]- [address], [address]  [phone], Mon-Fri 9am-7pm, Sat 9am-1pm [facility]- [address], [address] [place]  [phone] [facility]- [address], [address] [place]  [phone] [facility]- [address] [place] [address]  [phone] Renaye RakersVeita Bland- [address], [address], [phone]  Only accepts [place] patients after they have their name  applied to their card  Self Pay (no insurance) in [facility]: Sickle Cell Patients: Dr Willey BladeEric Dean, [facility]  [address], [phone] [facility]- [address]  [phone]       Redge Gainer-     [facility] [place]- [address], [address]       -     [facility]- see information above (Speak to CitigroupPam H if you do not have insurance)       -  [facility]- [address], [phone]       -  [facility]- [address],  [phone]       -  [facility]- [address] [place] [address], [phone]       -  Dr Julio Sickssei-Bonsu-  [address], [address], [place], [phone]       -  [facility]- [address], [phone]       -  [facility]- [address] [place] [address], [phone], also [address], [phone]       -    [facility]- [address], [phone], 1st & 3rd Saturday   every month, 10am-1pm  1) Find a Librarian, academicDoctor and Pay Out of Pocket Although  you won't have to find out who is covered by your insurance plan, it is a good idea to ask around and get recommendations. You will then need to call the office and see if the doctor you have chosen will accept you as a new patient and what types of options they offer for patients who are self-pay. Some doctors offer discounts or will set up payment plans for their patients who do not have insurance, but you will need to ask so you aren't surprised when you get to your appointment.  2) Contact Your Local Health Department Not all health departments have doctors that  can see patients for sick visits, but many do, so it is worth a call to see if yours does. If you don't know where your local health department is, you can check in your phone book. The CDC also has a tool to help you locate your state's health department, and many state websites also have listings of all of their local health departments.  3) Find a Walk-in Clinic If your illness is not likely to be very severe or complicated, you may want to try a walk in clinic. These are popping up all over the country in pharmacies, drugstores, and shopping centers. They're usually staffed by nurse practitioners or physician assistants that have been trained to treat common illnesses and complaints. They're usually fairly quick and inexpensive. However, if you have serious medical issues or chronic medical problems, these are probably not your best option  STD Testing [facility] Department of [facility], STD Clinic, [address] [place], phone [phone] or [phone].  Monday - Friday, call for an appointment. [facility] of [employer], STD Clinic, [address], [place], phone [phone] or [phone].  Monday - Friday, call for an appointment.  Abuse/Neglect: [facility] Child Abuse Hotline 743-334-6392 [facility] Child Abuse Hotline [phone] (After Hours)  Emergency Shelter:  [facility] [phone]  Maternity Homes: Room at the [place] [facility] [phone] [facility] [phone]  MRSA Hotline #:   814-192-7385  [facility] Resources  [facility] [place] [facility]  United Way [facility]. [address]                 [address]         [address] [place] [address]  Blondell Reveal Phone:  [phone]                                  Phone:  [phone]                    Phone:  [phone]  [facility], [phone] [phone]       -     [facility] in [place], [address]  [phone], [facility]  [place] Child Abuse Hotline [phone]) 8301623356 or 970-372-2292(336) [phone] (After Hours)   Behavioral Health Services  Substance Abuse Resources: [facility] Drug [facility]  [phone] [facility] [phone] [facility] [phone] [facility] [phone] Residential & Outpatient Substance Abuse Program  [phone]  Psychological Services: [facility]  [phone] [facility]  [phone] [facility], [phone] [place], [place], ACCESS LINE: [phone] or [phone], [email or web address]  Dental Assistance  If unable to pay or uninsured, contact:  [facility] or [facility] to become qualified for the adult dental clinic.  Patients with Medicaid: [facility] [phone] [address], [phone], [phone]  If unable to pay, or uninsured, contact [facility] [phone]) or [facility] [phone] in [place], [phone] in [facility]) to become qualified for the adult dental clinic   Other Low-Cost Community Dental Services: [facility]- [address], [place], [place], [phone], [phone], [address]. [address], 2nd and 4th Thursday of the month at 6:30am.  10 clients each day by appointment, can sometimes see walk-in patients if someone does not show for an appointment. [facility]- [address], [place], [place], [phone], [phone] [facility]- [address], [place], [place], [phone], [phone] [facility]- [phone] [facility]- [phone] [facility]  [phone]

## 2018-02-21 NOTE — ED Triage Notes (Signed)
Pt reports slamming left pinky into car door. Two small cuts noted to pinky with swelling. Pain 6/10. Ice pack given in triage.

## 2018-02-21 NOTE — ED Provider Notes (Signed)
[facility] EM[facility]ENCY [facility] Provider Note   CSN: [id number] Arrival date & time: 02/21/18  1646     History   Chief Complaint Chief Complaint  Patient presents with  . Finger Injury    HPI Cole Johnson is a 32 y.o. male presenting for left fifth finger pain after shutting his finger in a car door approximately 330 this afternoon.  Patient states that pain was immediate, throbbing and 6/10 in nature.  He has not taken anything for his pain.  Patient states that pain is worse with palpation and movement of the finger.  Additionally patient with 2 small lacerations to either side of his left fifth finger, bleeding controlled with direct pressure.  HPI  History reviewed. No pertinent past medical history.  There are no active problems to display for this patient.   History reviewed. No pertinent surgical history.      Home Medications    Prior to Admission medications   Medication Sig Start Date End Date Taking? Authorizing Provider  dicyclomine (BENTYL) 20 MG tablet Take 1 tablet (20 mg total) by mouth 2 (two) times daily as needed for spasms. Patient not taking: Reported on 02/21/2018 01/06/17   Ward, Chase PicketJaime Pilcher, PA-C    Family History No family history on file.  Social History Social History   Tobacco Use  . Smoking status: Current Every Day Smoker    Packs/day: 0.30    Types: Cigarettes  . Smokeless tobacco: Never Used  Substance Use Topics  . Alcohol use: Yes    Comment: about 20 oz of beer per day and .5 oz of liquor  . Drug use: No     Allergies   Penicillins   Review of Systems Review of Systems  Constitutional: Negative.  Negative for chills, diaphoresis and fever.  Musculoskeletal: Positive for arthralgias and joint swelling.  Skin: Positive for wound. Negative for color change.  Neurological: Negative.  Negative for weakness and numbness.     Physical Exam Updated Vital Signs BP (!) 137/99 (BP Location: Right  Arm)   Pulse 64   Temp 98.8 F (37.1 C) (Oral)   Resp 15   Ht 6\' 2"  (1.88 m)   Wt 90.7 kg   SpO2 96%   BMI 25.68 kg/m   Physical Exam  Constitutional: He is oriented to person, place, and time. He appears well-developed and well-nourished. No distress.  HENT:  Head: Normocephalic and atraumatic.  Right Ear: External ear normal.  Left Ear: External ear normal.  Nose: Nose normal.  Eyes: Pupils are equal, round, and reactive to light. EOM are normal.  Neck: Trachea normal and normal range of motion. No tracheal deviation present.  Pulmonary/Chest: Effort normal. No respiratory distress.  Musculoskeletal: Normal range of motion.       Hands: Patient with swelling and skin abrasions as marked above. Tenderness to palpation over left 5th proximal phalanx. No snuffbox tenderness to palpation. No tenderness to palpation over flexor sheath.  Finger adduction/abduction intact with 5/5 strength.  Thumb opposition intact. Full active and resisted ROM to flexion/extension at wrist, MCP, PIP and DIP of all fingers.  FDS/FDP intact. Grip 5/5 strength.  Radial artery 2+ with <2sec cap refill in all fingers.  Sensation intact to light-tough in median/ulnar/radial distributions.   Neurological: He is alert and oriented to person, place, and time. No sensory deficit.  Skin: Skin is warm and dry. Capillary refill takes less than 2 seconds.  Psychiatric: He has a normal mood and  affect. His behavior is normal.     ED Treatments / Results  Labs (all labs ordered are listed, but only abnormal results are displayed) Labs Reviewed - No data to display  EKG None  Radiology Dg Finger Little Left  Result Date: 02/21/2018 CLINICAL DATA:  Little finger injury EXAM: LEFT LITTLE FINGER 2+V COMPARISON:  None. FINDINGS: There is no evidence of fracture or dislocation. There is no evidence of arthropathy or other focal bone abnormality. Soft tissues are unremarkable. IMPRESSION: Negative. Electronically  Signed   By: Deatra RobinsonKevin  Herman M.D.   On: 02/21/2018 17:35    Procedures Procedures (including critical care time)  Medications Ordered in ED Medications - No data to display   Initial Impression / Assessment and Plan / ED Course  I have reviewed the triage vital signs and the nursing notes.  Pertinent labs & imaging results that were available during my care of the patient were reviewed by me and considered in my medical decision making (see chart for details).    Patient presenting with left fifth finger injury that occurred today.  Imaging negative for acute findings.  Patient is neurovascularly intact to the left fifth finger as well as the entire left hand.  Patient with flexion and extension of the left fifth finger fully intact to all joints.  Provided splint here in the emergency department.  Patient and informed that he may use over-the-counter anti-inflammatory medications for his pain.  Patient instructed on rest ice compression and elevation.  Patient given resources to establish primary care provider and encouraged to follow-up with him regarding his visit today.  Patient has a small abrasion and small laceration are not suturable here in emergency department, the circular abrasion has no skin to repair, the small laceration is approximately 1 mm in depth and is not suturable.  There is no evidence of foreign body during my evaluation.  Bleeding controlled with direct pressure by patient prior to arrival.  Wound was irrigated by myself and bottom of wound was seen in a bloodless field.  No tendon, vessel or nerve involvement noted.  Wound was dressed with bacitracin and sterile gauze by nursing staff.  Splint was placed on finger.  At this time there does not appear to be any evidence of an acute emergency medical condition and the patient appears stable for discharge with appropriate outpatient follow up. Diagnosis was discussed with patient who verbalizes understanding of care plan and  is agreeable to discharge. I have discussed return precautions with patient and family who verbalize understanding of return precautions. Patient strongly encouraged to follow-up with their PCP. All questions answered.    Note: Portions of this report may have been transcribed using voice recognition software. Every effort was made to ensure accuracy; however, inadvertent computerized transcription errors may still be present.   Final Clinical Impressions(s) / ED Diagnoses   Final diagnoses:  Injury of finger of left hand, initial encounter    ED Discharge Orders    None       Elizabeth PalauMorelli, Brandon A, PA-C 02/21/18 2343    Gwyneth SproutPlunkett, Whitney, MD 02/21/18 2349

## 2021-12-15 ENCOUNTER — Encounter (HOSPITAL_COMMUNITY): Payer: Self-pay

## 2021-12-15 ENCOUNTER — Emergency Department (HOSPITAL_COMMUNITY): Payer: BC Managed Care – PPO

## 2021-12-15 ENCOUNTER — Emergency Department (HOSPITAL_COMMUNITY)
Admission: EM | Admit: 2021-12-15 | Discharge: 2021-12-16 | Disposition: A | Discharge status: Home / Self Care | Payer: BC Managed Care – PPO | Attending: Emergency Medicine | Admitting: Emergency Medicine

## 2021-12-15 DIAGNOSIS — F17200 Nicotine dependence, unspecified, uncomplicated: Secondary | ICD-10-CM | POA: Diagnosis not present

## 2021-12-15 DIAGNOSIS — R4701 Aphasia: Secondary | ICD-10-CM | POA: Diagnosis not present

## 2021-12-15 DIAGNOSIS — I10 Essential (primary) hypertension: Secondary | ICD-10-CM | POA: Diagnosis not present

## 2021-12-15 DIAGNOSIS — R200 Anesthesia of skin: Secondary | ICD-10-CM | POA: Diagnosis present

## 2021-12-15 DIAGNOSIS — Z20822 Contact with and (suspected) exposure to covid-19: Secondary | ICD-10-CM | POA: Insufficient documentation

## 2021-12-15 DIAGNOSIS — R202 Paresthesia of skin: Secondary | ICD-10-CM | POA: Diagnosis not present

## 2021-12-15 LAB — COMPREHENSIVE METABOLIC PANEL
ALT: 64 U/L — ABNORMAL HIGH (ref 0–44)
AST: 81 U/L — ABNORMAL HIGH (ref 15–41)
Albumin: 5.1 g/dL — ABNORMAL HIGH (ref 3.5–5.0)
Alkaline Phosphatase: 86 U/L (ref 38–126)
Anion gap: 9 (ref 5–15)
BUN: 10 mg/dL (ref 6–20)
CO2: 24 mmol/L (ref 22–32)
Calcium: 10 mg/dL (ref 8.9–10.3)
Chloride: 107 mmol/L (ref 98–111)
Creatinine, Ser: 0.87 mg/dL (ref 0.61–1.24)
GFR, Estimated: >60 mL/min (ref >60)
Glucose, Bld: 99 mg/dL (ref 70–99)
Potassium: 3.3 mmol/L — ABNORMAL LOW (ref 3.5–5.1)
Sodium: 140 mmol/L (ref 135–145)
Total Bilirubin: 0.7 mg/dL (ref 0.3–1.2)
Total Protein: 8.8 g/dL — ABNORMAL HIGH (ref 6.5–8.1)

## 2021-12-15 LAB — CBC
HCT: 42.3 % (ref 39.0–52.0)
Hemoglobin: 15.3 g/dL (ref 13.0–17.0)
MCH: 30 pg (ref 26.0–34.0)
MCHC: 36.2 g/dL — ABNORMAL HIGH (ref 30.0–36.0)
MCV: 82.9 fL (ref 80.0–100.0)
Platelets: 293 10*3/uL (ref 150–400)
RBC: 5.1 MIL/uL (ref 4.22–5.81)
RDW: 13.5 % (ref 11.5–15.5)
WBC: 8.6 10*3/uL (ref 4.0–10.5)
nRBC: 0 % (ref 0.0–0.2)

## 2021-12-15 LAB — DIFFERENTIAL
Abs Immature Granulocytes: 0.02 10*3/uL (ref 0.00–0.07)
Basophils Absolute: 0.1 10*3/uL (ref 0.0–0.1)
Basophils Relative: 1 %
Eosinophils Absolute: 0.1 10*3/uL (ref 0.0–0.5)
Eosinophils Relative: 1 %
Immature Granulocytes: 0 %
Lymphocytes Relative: 43 %
Lymphs Abs: 3.7 10*3/uL (ref 0.7–4.0)
Monocytes Absolute: 0.9 10*3/uL (ref 0.1–1.0)
Monocytes Relative: 10 %
Neutro Abs: 3.8 10*3/uL (ref 1.7–7.7)
Neutrophils Relative %: 45 %

## 2021-12-15 LAB — RAPID URINE DRUG SCREEN, HOSP PERFORMED
Amphetamines: NOT DETECTED
Barbiturates: NOT DETECTED
Benzodiazepines: NOT DETECTED
Cocaine: NOT DETECTED
Opiates: NOT DETECTED
Tetrahydrocannabinol: NOT DETECTED

## 2021-12-15 LAB — URINALYSIS, ROUTINE W REFLEX MICROSCOPIC
Bacteria, UA: NONE SEEN
Bilirubin Urine: NEGATIVE
Glucose, UA: NEGATIVE mg/dL
Ketones, ur: NEGATIVE mg/dL
Nitrite: NEGATIVE
Protein, ur: NEGATIVE mg/dL
Specific Gravity, Urine: 1.006 (ref 1.005–1.030)
pH: 5 (ref 5.0–8.0)

## 2021-12-15 LAB — ETHANOL: Alcohol, Ethyl (B): 220 mg/dL — ABNORMAL HIGH (ref <10)

## 2021-12-15 LAB — I-STAT CHEM 8, ED
BUN: 8 mg/dL (ref 6–20)
Calcium, Ion: 1.17 mmol/L (ref 1.15–1.40)
Chloride: 103 mmol/L (ref 98–111)
Creatinine, Ser: 1.2 mg/dL (ref 0.61–1.24)
Glucose, Bld: 100 mg/dL — ABNORMAL HIGH (ref 70–99)
HCT: 46 % (ref 39.0–52.0)
Hemoglobin: 15.6 g/dL (ref 13.0–17.0)
Potassium: 3.4 mmol/L — ABNORMAL LOW (ref 3.5–5.1)
Sodium: 142 mmol/L (ref 135–145)
TCO2: 23 mmol/L (ref 22–32)

## 2021-12-15 LAB — RESP PANEL BY RT-PCR (FLU A&B, COVID) ARPGX2
Influenza A by PCR: NEGATIVE
Influenza B by PCR: NEGATIVE
SARS Coronavirus 2 by RT PCR: NEGATIVE

## 2021-12-15 LAB — APTT: aPTT: 30 seconds (ref 24–36)

## 2021-12-15 LAB — PROTIME-INR
INR: 0.9 (ref 0.8–1.2)
Prothrombin Time: 11.9 seconds (ref 11.4–15.2)

## 2021-12-15 NOTE — ED Triage Notes (Signed)
Pt arrived via POV, c/o expressive aphasia, headache, dizziness, feeling off overall as well as numbness and tingling to right arm that started at 9am this morning.

## 2021-12-15 NOTE — ED Provider Triage Note (Signed)
Emergency Medicine Provider Triage Evaluation Note  Cole Johnson , a 36 y.o. male  was evaluated in triage.  Pt complains of numbness and tingling to his right upper extremity.  Patient states that this morning around 930 or 10 AM he developed numbness into his right upper extremity.  Patient says that he has a history of high blood pressure, is not currently taking any medication.  Patient states that the last known normal time was around 9 AM.  Patient discussed with Dr. Effie Shy who advised me to not activate code stroke or teleneurology but to order head CT.  Review of Systems  Positive:  Negative:   Physical Exam  BP (!) 185/123 (BP Location: Left Arm)   Pulse (!) 105   Temp 98.7 F (37.1 C) (Oral)   Resp 20   Ht 6\' 2"  (1.88 m)   Wt 97.5 kg   SpO2 99%   BMI 27.60 kg/m  Gen:   Awake, no distress   Resp:  Normal effort  MSK:   Moves extremities without difficulty  Other:  Decree sensation right side of 4, right upper extremity on examination.  5-5 strength of bilateral upper and lower extremities.  Medical Decision Making  Medically screening exam initiated at 6:48 PM.  Appropriate orders placed.  was informed that the remainder of the evaluation will be completed by another provider, this initial triage assessment does not replace that evaluation, and the importance of remaining in the ED until their evaluation is complete.     Cole Kind, PA-C 12/15/21 1849

## 2021-12-15 NOTE — ED Triage Notes (Signed)
Pt sent from [facility] for MRI

## 2021-12-15 NOTE — ED Provider Notes (Signed)
8:58 PM Call from PA Delice Bison for ED to ED transfer.  Currently at [facility] without MRI capabilities.  Right sided numbness and tingling in RUE, possible aphasia this morning with symptom onset was 930/10AM. NIH stroke scale 0.  Smoker, untreated hypertension  Workup:  Stable CT Head  Plan: Transfer to [facility] for MRI. Control blood pressure if no stroke and DC home.    Franne Forts, DO 12/15/21 2101

## 2021-12-15 NOTE — Discharge Instructions (Signed)
Please report to the [facility] ED for MRI. Dr. Wallace Cullens knows that you are coming.

## 2021-12-15 NOTE — ED Provider Notes (Signed)
[facility]-EMERGENCY DEPT Provider Note   CSN: [id number] Arrival date & time: 12/15/21  1829     History  Chief Complaint  Patient presents with   Aphasia    Cole Johnson is a 36 y.o. male with medical history of hypertension, current smoker.  The patient presents to the ED for evaluation of right-sided numbness and tingling as well as expressive aphasia.  Reports that this morning at 9:30 AM, he developed right-sided numbness and tingling into his right upper extremity.  The patient also stated at this time he had slurred speech, inability to find words.  Patient states that the symptoms decided.  Patient does still have right-sided numbness and tingling however.  Patient also has numbness and tingling of the right side of his forehead.  Patient denies any recent falls, fevers, nausea, vomiting, shortness of breath, chest pain.  Patient reports that he has high blood pressure however he has not been placed on any medications by his PCP.  HPI     Home Medications Prior to Admission medications   Medication Sig Start Date End Date Taking? Authorizing Provider  dicyclomine (BENTYL) 20 MG tablet Take 1 tablet (20 mg total) by mouth 2 (two) times daily as needed for spasms. Patient not taking: Reported on 02/21/2018 01/06/17   Ward, Chase Picket, PA-C      Allergies    Penicillins    Review of Systems   Review of Systems  Neurological:  Positive for speech difficulty and numbness.  All other systems reviewed and are negative.  Physical Exam Updated Vital Signs BP (!) 155/109   Pulse 75   Temp 98.7 F (37.1 C) (Oral)   Resp 17   Ht 6\' 2"  (1.88 m)   Wt 97.5 kg   SpO2 94%   BMI 27.60 kg/m  Physical Exam Vitals and nursing note reviewed.  Constitutional:      General: He is not in acute distress.    Appearance: Normal appearance. He is not ill-appearing, toxic-appearing or diaphoretic.  HENT:     Head: Normocephalic and atraumatic.     Nose: Nose  normal. No congestion.     Mouth/Throat:     Mouth: Mucous membranes are moist.     Pharynx: Oropharynx is clear.  Eyes:     Extraocular Movements: Extraocular movements intact.     Conjunctiva/sclera: Conjunctivae normal.     Pupils: Pupils are equal, round, and reactive to light.  Cardiovascular:     Rate and Rhythm: Normal rate and regular rhythm.  Pulmonary:     Effort: Pulmonary effort is normal.     Breath sounds: Normal breath sounds. No wheezing.  Abdominal:     General: Abdomen is flat. Bowel sounds are normal.     Palpations: Abdomen is soft.     Tenderness: There is no abdominal tenderness.  Musculoskeletal:     Cervical back: Normal range of motion and neck supple. No rigidity or tenderness.  Skin:    General: Skin is warm and dry.     Capillary Refill: Capillary refill takes less than 2 seconds.  Neurological:     General: No focal deficit present.     Mental Status: He is alert and oriented to person, place, and time.     GCS: GCS eye subscore is 4. GCS verbal subscore is 5. GCS motor subscore is 6.     Cranial Nerves: Cranial nerve deficit present.     Sensory: No sensory deficit.  Comments: 5 out of 5 strength to upper and lower extremities bilaterally.  Decree sensation to right upper extremity, right side of forehead.  No facial droop.    ED Results / Procedures / Treatments   Labs (all labs ordered are listed, but only abnormal results are displayed) Labs Reviewed  ETHANOL - Abnormal; Notable for the following components:      Result Value   Alcohol, Ethyl (B) 220 (*)    All other components within normal limits  CBC - Abnormal; Notable for the following components:   MCHC 36.2 (*)    All other components within normal limits  COMPREHENSIVE METABOLIC PANEL - Abnormal; Notable for the following components:   Potassium 3.3 (*)    Total Protein 8.8 (*)    Albumin 5.1 (*)    AST 81 (*)    ALT 64 (*)    All other components within normal limits   I-STAT CHEM 8, ED - Abnormal; Notable for the following components:   Potassium 3.4 (*)    Glucose, Bld 100 (*)    All other components within normal limits  RESP PANEL BY RT-PCR (FLU A&B, COVID) ARPGX2  PROTIME-INR  APTT  DIFFERENTIAL  RAPID URINE DRUG SCREEN, HOSP PERFORMED  URINALYSIS, ROUTINE W REFLEX MICROSCOPIC    EKG None  Radiology CT HEAD WO CONTRAST ( )  Result Date: 12/15/2021 CLINICAL DATA:  Numbness and tingling to the right upper extremity EXAM: CT HEAD WITHOUT CONTRAST TECHNIQUE: Contiguous axial images were obtained from the base of the skull through the vertex without intravenous contrast. RADIATION DOSE REDUCTION: This exam was performed according to the departmental dose-optimization program which includes automated exposure control, adjustment of the mA and/or kV according to patient size and/or use of iterative reconstruction technique. COMPARISON:  None Available. FINDINGS: Brain: No evidence of acute infarction, hemorrhage, hydrocephalus, extra-axial collection or mass lesion/mass effect. Vascular: No hyperdense vessel or unexpected calcification. Skull: Normal. Negative for fracture or focal lesion. Sinuses/Orbits: No acute finding. Other: None IMPRESSION: Negative non contrasted CT appearance of the brain Electronically Signed   By: Jasmine Pang M.D.   On: 12/15/2021 19:21    Procedures Procedures    Medications Ordered in ED Medications - No data to display  ED Course/ Medical Decision Making/ A&P                           Medical Decision Making Amount and/or Complexity of Data Reviewed Labs: ordered. Radiology: ordered.   36 year old male presents to the ED for evaluation.  Please see HPI for further details.  Patient last known well time was 9 AM this morning.  Patient had CT here which was negative for any intracranial abnormality however the patient does still have symptoms and right-sided numbness.  MRI tech at [facility] did not return  calls, charge nurse here at [facility] is also unable to locate MRI tech.  Patient needs imaging of head with MRI, he will need to be seen at [facility] for this.  I have called and discussed this patient with Dr. Wallace Cullens and she is aware that he is on the way.   Final Clinical Impression(s) / ED Diagnoses Final diagnoses:  Numbness    Rx / DC Orders ED Discharge Orders     None         Clent Ridges 12/15/21 2108    Derwood Kaplan, MD 12/16/21 1609

## 2021-12-16 ENCOUNTER — Ambulatory Visit (HOSPITAL_COMMUNITY): Admission: RE | Admit: 2021-12-16 | Payer: BC Managed Care – PPO | Source: Ambulatory Visit

## 2022-11-20 IMAGING — CT CT HEAD W/O CM
3 series · 14 of 47 positions shown, 16 images · non-contrast
Comparison: None Available.

CLINICAL DATA: Numbness and tingling to the right upper extremity



[Series 2: head wo · axial · 0.50mm/px · z∈[-144,-9]mm · 8 of 33 slices shown, 10 images]
[im 3/33  brain]
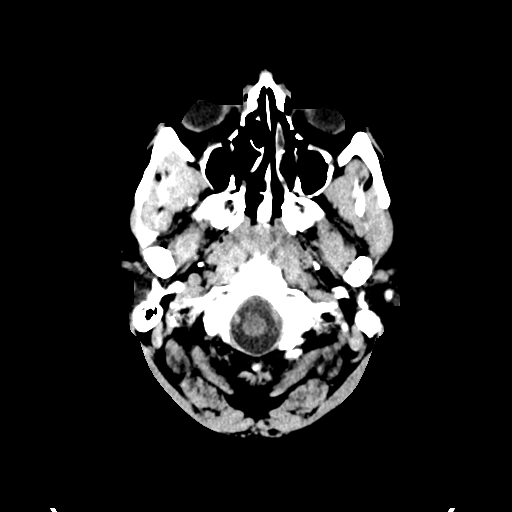
[im 3/33  bone]
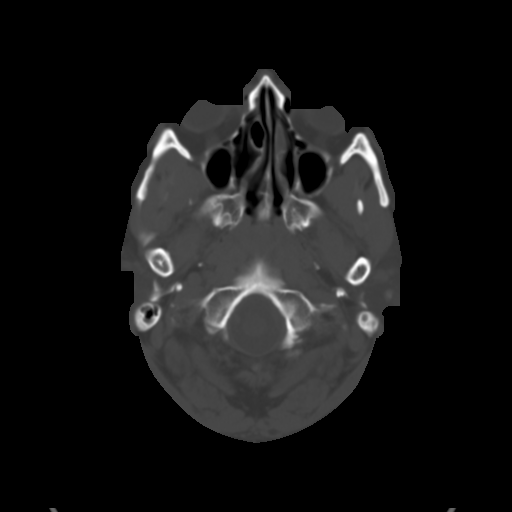
[im 7/33  brain]
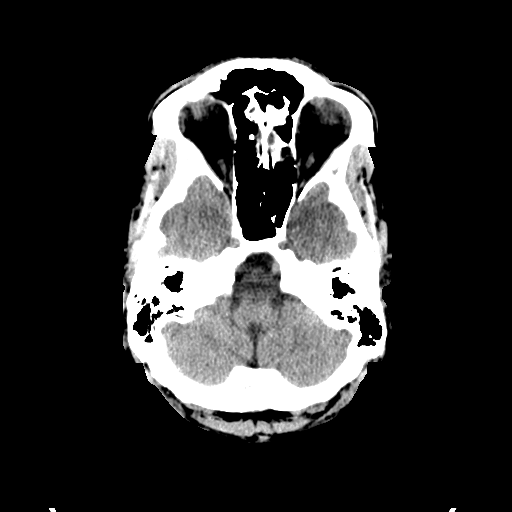
[im 10/33  brain]
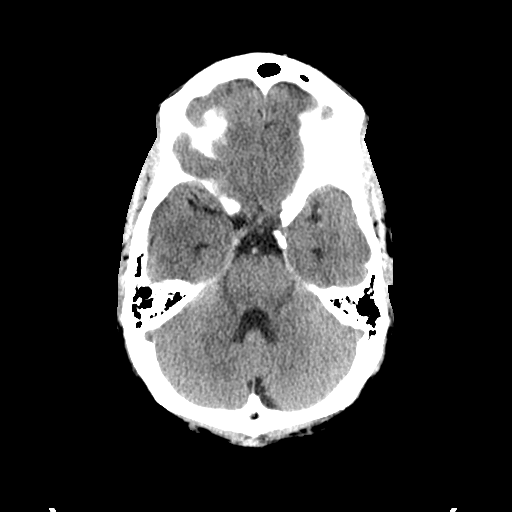
[im 15/33  brain]
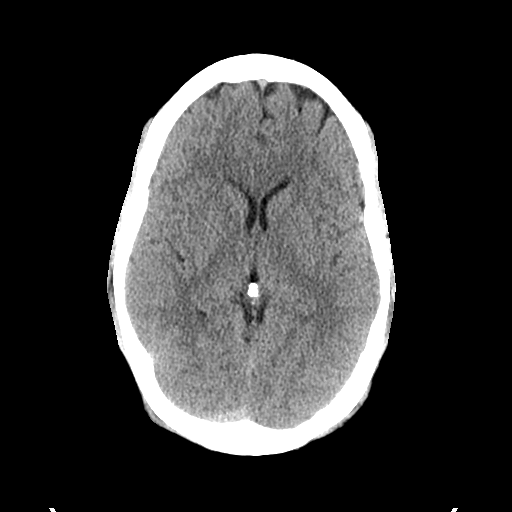
[im 18/33  brain]
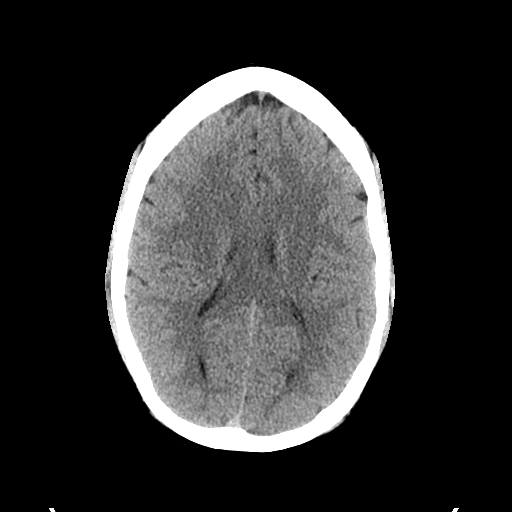
[im 18/33  bone]
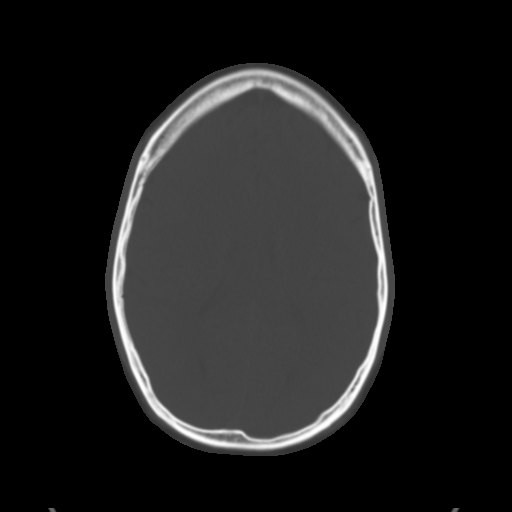
[im 23/33  brain]
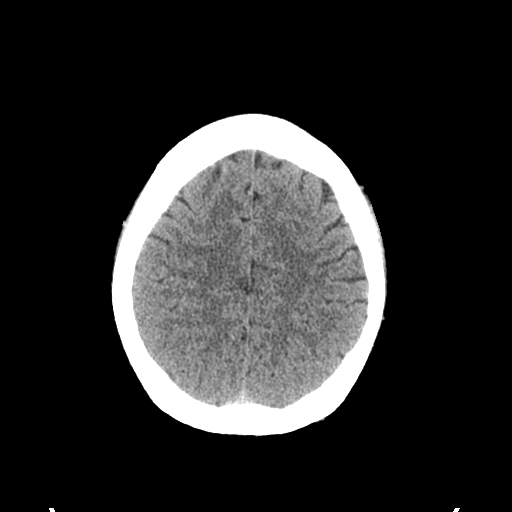
[im 26/33  brain]
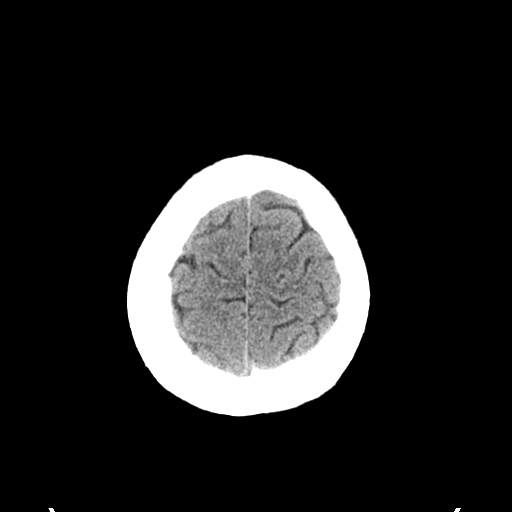
[im 30/33  brain]
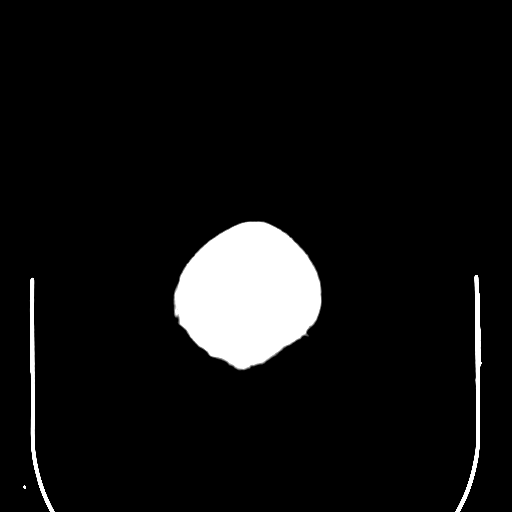

[Series 4: coronal soft tissue · coronal · 0.35mm/px · 3 of 71 slices shown]
[im 24/71  brain]
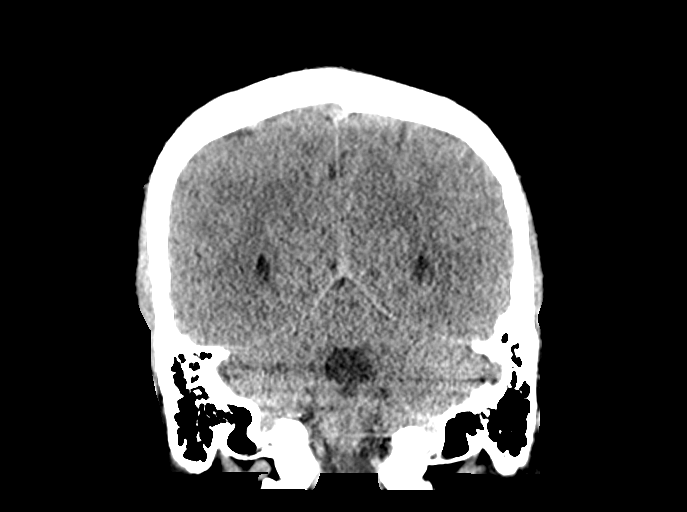
[im 32/71  brain]
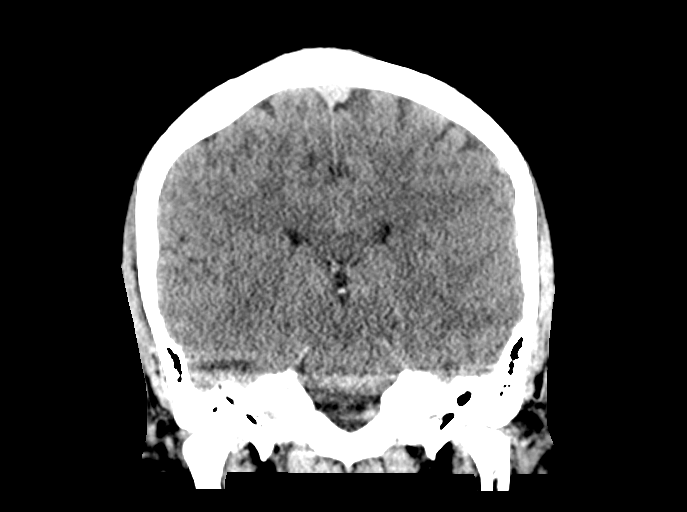
[im 39/71  brain]
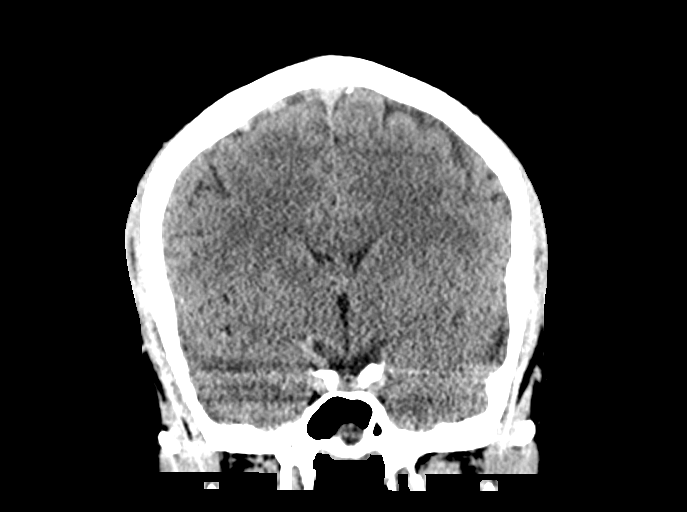

[Series 5: sagittal soft tissue · sagittal · 0.36mm/px · 3 of 54 slices shown]
[im 18/54  brain]
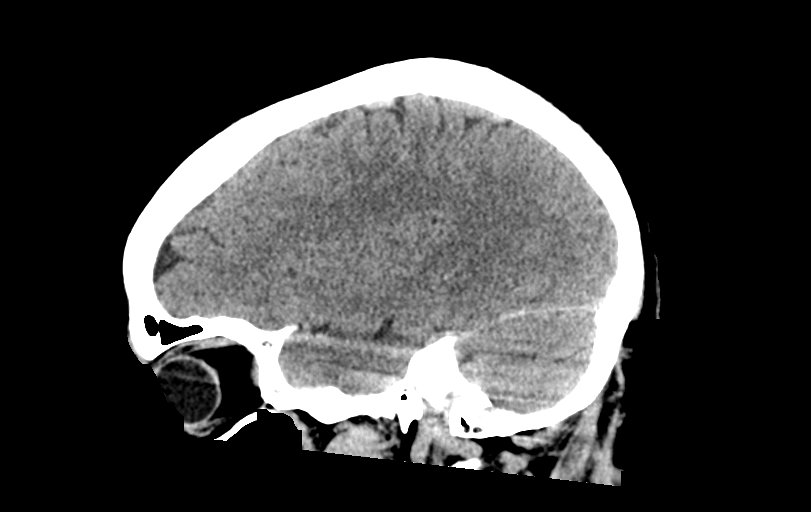
[im 27/54  brain]
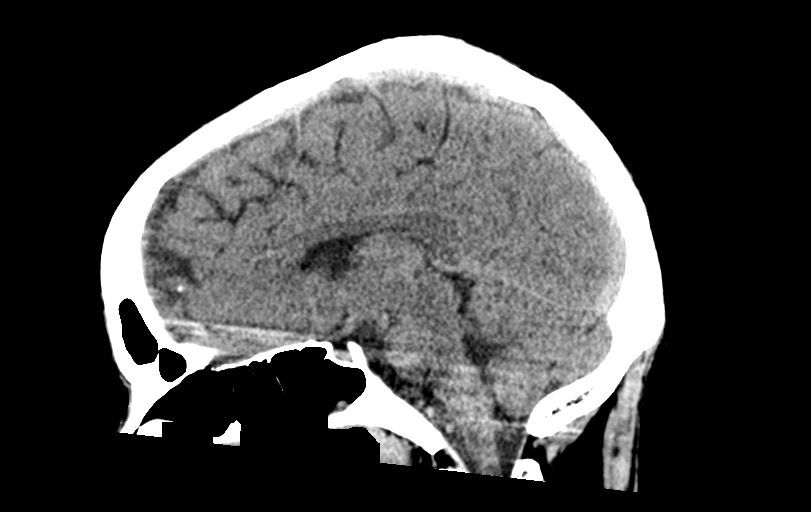
[im 36/54  brain]
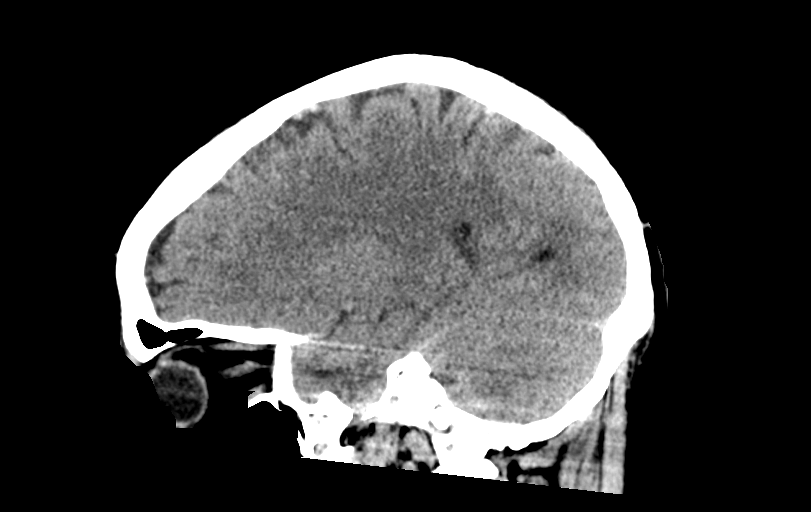

[14 of 47 positions shown; findings below may reference images not displayed]

FINDINGS: Brain: No evidence of acute infarction, hemorrhage, hydrocephalus,
extra-axial collection or mass lesion/mass effect.

Vascular: No hyperdense vessel or unexpected calcification.

Skull: Normal. Negative for fracture or focal lesion.

Sinuses/Orbits: No acute finding.

Other: None
IMPRESSION: Negative non contrasted CT appearance of the brain

## 2022-11-24 ENCOUNTER — Other Ambulatory Visit: Payer: Self-pay | Admitting: Internal Medicine

## 2022-11-24 DIAGNOSIS — R748 Abnormal levels of other serum enzymes: Secondary | ICD-10-CM

## 2022-11-27 ENCOUNTER — Ambulatory Visit
Admission: RE | Admit: 2022-11-27 | Discharge: 2022-11-27 | Disposition: A | Discharge status: Home / Self Care | Payer: BC Managed Care – PPO | Source: Ambulatory Visit | Attending: Internal Medicine | Admitting: Internal Medicine

## 2022-11-27 DIAGNOSIS — R748 Abnormal levels of other serum enzymes: Secondary | ICD-10-CM

## 2024-08-17 ENCOUNTER — Ambulatory Visit: Payer: Self-pay | Admitting: Internal Medicine

## 2024-08-17 ENCOUNTER — Encounter: Payer: Self-pay | Admitting: Internal Medicine

## 2024-08-17 ENCOUNTER — Other Ambulatory Visit: Payer: Self-pay

## 2024-08-17 VITALS — BP 146/94 | HR 85 | Temp 98.9°F | Resp 16 | Wt 197.0 lb

## 2024-08-17 DIAGNOSIS — A539 Syphilis, unspecified: Secondary | ICD-10-CM

## 2024-08-17 DIAGNOSIS — Z880 Allergy status to penicillin: Secondary | ICD-10-CM

## 2024-08-17 MED ORDER — DOXYCYCLINE HYCLATE 100 MG PO TABS: 100.0000 mg | ORAL_TABLET | Freq: Two times a day (BID) | ORAL | 0 refills | Status: AC

## 2024-08-17 NOTE — Progress Notes (Signed)
 "       [facility]  Reason for Consult:syphilis Referring Provider: Rinka Pahwani      There are no active problems to display for this patient.     HPI: Cole Johnson is a 39 y.o. male referred by pcp for syphilis  He was seen 08/09/24 by pcp. Chart reviewed Initially seen for lips swelling in setting lisinopril  He asked for std testing as he was just with a new male partner 2 weeks prior No sexual encounter within 4 weeks prior to that Last time std testing age 75 negative  Rpr comes back positive titer 1:128 No rash, oral ulcer, penile ulcer or other sx  Pcn allergy as a child no other betalactam use  Referred by pcp here for treatment  No complaint or health concern today   We called the [employer] syphilis hotline and he has no prior syphilis testing in [place] He was born and raised and [place]    Review of Systems: ROS All other ros negative      No past medical history on file. Hypertension   Social History[1] No ivdu  No family history on file. N/a  Allergies[2]  OBJECTIVE: Vitals:   08/17/24 1501 08/17/24 1502  BP:  (!) 146/94  Pulse:  85  Resp:  16  Temp:  98.9 F (37.2 C)  TempSrc:  Oral  SpO2:  98%  Weight: 197 lb (89.4 kg)    Body mass index is 25.29 kg/m.   Physical Exam General/constitutional: no distress, pleasant HEENT: Normocephalic, PER, Conj Clear, EOMI, Oropharynx clear Neck supple CV: rrr no mrg Lungs: clear to auscultation, normal respiratory effort Abd: Soft, Nontender Ext: no edema Skin: No Rash Neuro: nonfocal MSK: no peripheral joint swelling/tenderness/warmth; back spines nontender Gu: no ulcer   Lab: 08/09/24 Rpr 1:128; treponemal ab positive Hiv screen negative Urine gc/chlam/trich negative Hep b sag, sab, cab nonreactive Hcv ab negative Cbc normal Lft normal   Microbiology:  Serology:  Imaging:   Assessment/plan: Problem List Items Addressed This Visit    None Visit Diagnoses       Syphilis    -  Primary     Penicillin allergy       Relevant Orders   Ambulatory referral to Allergy         Referral to pcn allergy testing. 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  Constance ONEIDA Passer, MD [facility] 08/17/2024, 3:05 PM     [1]  Social History Tobacco Use   Smoking status: Every Day    Current packs/day: 0.30    Types: Cigarettes   Smokeless tobacco: Never  Vaping Use   Vaping status: Never Used  Substance Use Topics   Alcohol use: Yes    Comment: about 20 oz of beer per day and .5 oz of liquor   Drug use: No  [2]  Allergies Allergen Reactions   Lisinopril Swelling   Penicillins Other (See Comments)    Has patient had a PCN reaction  causing immediate rash, facial/tongue/throat swelling, SOB or lightheadedness with hypotension: Unknown Has patient had a PCN reaction causing severe rash involving mucus membranes or skin necrosis: Unknown Has patient had a PCN reaction that required hospitalization: Unknown Has patient had a PCN reaction occurring within the last 10 years: No If all of the above answers are NO, then may proceed with Cephalosporin use.    "

## 2024-08-17 NOTE — Patient Instructions (Signed)
 Syphilis without rash/ulcer, while could be recent contraction, also could have been there for a while   Given this uncertainty treatment will be either 3 weekly shots of penicillin or 28 days of doxycycline    I have referred you to allergy for penicillin testing  For now, will do 28 days doxycycline    See us  again in around 6 weeks to recheck std

## 2024-10-05 ENCOUNTER — Ambulatory Visit: Payer: Self-pay | Admitting: Internal Medicine
